# Patient Record
Sex: Female | Born: 1960 | Race: White | Hispanic: No | Marital: Married | Smoking: Former smoker
Health system: Southern US, Community
[De-identification: ages and names within clinical notes are randomized; demographics above are authoritative.]

## PROBLEM LIST (undated history)

## (undated) DIAGNOSIS — J209 Acute bronchitis, unspecified: Secondary | ICD-10-CM

## (undated) DIAGNOSIS — E785 Hyperlipidemia, unspecified: Secondary | ICD-10-CM

## (undated) DIAGNOSIS — I10 Essential (primary) hypertension: Secondary | ICD-10-CM

## (undated) HISTORY — PX: ABDOMINAL HYSTERECTOMY: SHX81

## (undated) HISTORY — DX: Essential (primary) hypertension: I10

## (undated) HISTORY — PX: BREAST CYST EXCISION: SHX579

## (undated) HISTORY — DX: Acute bronchitis, unspecified: J20.9

## (undated) HISTORY — DX: Hyperlipidemia, unspecified: E78.5

## (undated) HISTORY — PX: GALLBLADDER SURGERY: SHX652

---

## 2004-07-19 ENCOUNTER — Ambulatory Visit: Payer: Self-pay

## 2004-10-09 ENCOUNTER — Ambulatory Visit: Payer: Self-pay | Admitting: Internal Medicine

## 2005-04-09 ENCOUNTER — Ambulatory Visit: Payer: Self-pay | Admitting: Family Medicine

## 2005-05-17 ENCOUNTER — Ambulatory Visit: Payer: Self-pay | Admitting: Gastroenterology

## 2005-11-05 ENCOUNTER — Ambulatory Visit: Payer: Self-pay | Admitting: Family Medicine

## 2005-11-13 ENCOUNTER — Other Ambulatory Visit: Payer: Self-pay

## 2005-11-15 ENCOUNTER — Ambulatory Visit: Payer: Self-pay | Admitting: Surgery

## 2006-01-28 ENCOUNTER — Ambulatory Visit (HOSPITAL_BASED_OUTPATIENT_CLINIC_OR_DEPARTMENT_OTHER): Admission: RE | Admit: 2006-01-28 | Discharge: 2006-01-28 | Payer: Self-pay | Admitting: Orthopedic Surgery

## 2007-07-28 ENCOUNTER — Ambulatory Visit: Payer: Self-pay | Admitting: Family Medicine

## 2007-08-23 ENCOUNTER — Observation Stay: Payer: Self-pay | Admitting: Internal Medicine

## 2007-08-23 ENCOUNTER — Other Ambulatory Visit: Payer: Self-pay

## 2007-09-04 ENCOUNTER — Ambulatory Visit: Payer: Self-pay | Admitting: Family Medicine

## 2007-09-22 ENCOUNTER — Ambulatory Visit: Payer: Self-pay | Admitting: Gastroenterology

## 2008-02-10 ENCOUNTER — Ambulatory Visit: Payer: Self-pay | Admitting: Family Medicine

## 2008-11-08 ENCOUNTER — Ambulatory Visit: Payer: Self-pay

## 2008-11-25 ENCOUNTER — Ambulatory Visit: Payer: Self-pay | Admitting: Unknown Physician Specialty

## 2008-12-02 ENCOUNTER — Ambulatory Visit: Payer: Self-pay | Admitting: Unknown Physician Specialty

## 2009-06-26 ENCOUNTER — Ambulatory Visit: Payer: Self-pay | Admitting: General Practice

## 2010-06-14 ENCOUNTER — Ambulatory Visit: Payer: Self-pay | Admitting: Family Medicine

## 2010-07-12 ENCOUNTER — Ambulatory Visit: Payer: Self-pay | Admitting: Surgery

## 2011-01-14 ENCOUNTER — Ambulatory Visit: Payer: Self-pay | Admitting: Family Medicine

## 2011-12-25 ENCOUNTER — Ambulatory Visit: Payer: Self-pay | Admitting: Family Medicine

## 2012-07-22 ENCOUNTER — Ambulatory Visit: Payer: Self-pay | Admitting: Surgery

## 2013-01-18 ENCOUNTER — Ambulatory Visit: Payer: Self-pay | Admitting: General Practice

## 2013-04-17 ENCOUNTER — Observation Stay: Payer: Self-pay | Admitting: Internal Medicine

## 2013-04-17 ENCOUNTER — Ambulatory Visit: Payer: Self-pay

## 2013-04-17 LAB — CBC
MCH: 30.1 pg (ref 26.0–34.0)
MCHC: 35.6 g/dL (ref 32.0–36.0)
RBC: 4.57 10*6/uL (ref 3.80–5.20)
RDW: 13.2 % (ref 11.5–14.5)

## 2013-04-17 LAB — CK TOTAL AND CKMB (NOT AT ARMC)
CK, Total: 75 U/L (ref 21–215)
CK-MB: <0.5 ng/mL — ABNORMAL LOW (ref 0.5–3.6)

## 2013-04-17 LAB — TROPONIN I
Troponin-I: <0.02 ng/mL
Troponin-I: <0.02 ng/mL

## 2013-04-17 LAB — BASIC METABOLIC PANEL
BUN: 11 mg/dL (ref 7–18)
Calcium, Total: 9 mg/dL (ref 8.5–10.1)
EGFR (African American): >60
Osmolality: 275 (ref 275–301)

## 2013-04-18 LAB — CK TOTAL AND CKMB (NOT AT ARMC): CK-MB: <0.5 ng/mL — ABNORMAL LOW (ref 0.5–3.6)

## 2013-04-18 LAB — TROPONIN I: Troponin-I: <0.02 ng/mL

## 2013-04-26 ENCOUNTER — Ambulatory Visit (INDEPENDENT_AMBULATORY_CARE_PROVIDER_SITE_OTHER): Payer: 59 | Admitting: Cardiovascular Disease

## 2013-04-26 ENCOUNTER — Encounter: Payer: Self-pay | Admitting: Cardiovascular Disease

## 2013-04-26 VITALS — BP 130/80 | HR 79 | Ht 62.0 in | Wt 176.0 lb

## 2013-04-26 DIAGNOSIS — J40 Bronchitis, not specified as acute or chronic: Secondary | ICD-10-CM | POA: Insufficient documentation

## 2013-04-26 DIAGNOSIS — F17200 Nicotine dependence, unspecified, uncomplicated: Secondary | ICD-10-CM | POA: Insufficient documentation

## 2013-04-26 DIAGNOSIS — R079 Chest pain, unspecified: Secondary | ICD-10-CM

## 2013-04-26 DIAGNOSIS — R002 Palpitations: Secondary | ICD-10-CM

## 2013-04-26 DIAGNOSIS — J449 Chronic obstructive pulmonary disease, unspecified: Secondary | ICD-10-CM

## 2013-04-26 DIAGNOSIS — R29898 Other symptoms and signs involving the musculoskeletal system: Secondary | ICD-10-CM

## 2013-04-26 DIAGNOSIS — M5382 Other specified dorsopathies, cervical region: Secondary | ICD-10-CM

## 2013-04-26 NOTE — Assessment & Plan Note (Signed)
Does not appear to have active anginal symptoms. We have discussed various treatment options for her. She would prefer to hold off on any stress testing at this time as she feels well

## 2013-04-26 NOTE — Patient Instructions (Addendum)
You are doing well. No medication changes were made.  Please call the office if you have worsening shortness of breath or chest pain We would order a stress test  Please call us if you have new issues that need to be addressed before your next appt.  Your physician wants you to follow-up in: 12 months.  You will receive a reminder letter in the mail two months in advance. If you don't receive a letter, please call our office to schedule the follow-up appointment.

## 2013-04-26 NOTE — Assessment & Plan Note (Signed)
Bronchitis improved after Z-Pak

## 2013-04-26 NOTE — Assessment & Plan Note (Signed)
We have encouraged her to continue to work on weaning her cigarettes and smoking cessation. She will continue to work on this and does not want any assistance with chantix.  

## 2013-04-26 NOTE — Assessment & Plan Note (Signed)
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

## 2013-04-26 NOTE — Assessment & Plan Note (Signed)
Likely has mild underlying COPD. May benefit from albuterol inhaler

## 2013-04-26 NOTE — Progress Notes (Signed)
   Patient ID: Jackie Petty, female    DOB: 1961/01/15, 52 y.o.   MRN: [id number]  HPI Comments: Jackie Petty is a 52 year old woman with long history of smoking for 30 years, hyperlipidemia, with admission to the hospital August 2 until 04/18/2013 with symptoms of chest pain, diagnosed with acute bronchitis, hypertension, hyperlipidemia. She reports that she had a prednisone and Z-Pak which improved her symptoms her symptoms were pleuritic, chest pain with deep inspiration, thick rattly cough. Symptoms have now resolved and she has no further chest pain symptoms. Cardiac enzymes in the hospital were negative there was EKG change, d-dimer was normal  She does have periodic episodes of bronchitis She is active at baseline, no recent symptoms when doing her ADLs She works long hours in a machine shop   EKG shows normal sinus rhythm with rate 79 beats per minute, no significant ST or T wave changes      Outpatient Encounter Prescriptions as of 04/26/2013  Medication Sig Dispense Refill  . acetaminophen (TYLENOL) 325 MG tablet Take 650 mg by mouth every 6 (six) hours as needed for pain.      Marland Kitchen aspirin 81 MG tablet Take 81 mg by mouth daily.      . hydrochlorothiazide (MICROZIDE) 12.5 MG capsule Take 12.5 mg by mouth daily.      . nicotine (NICODERM CQ - DOSED IN MG/24 HOURS) 14 mg/24hr patch Place 1 patch onto the skin daily.      . pravastatin (PRAVACHOL) 40 MG tablet Take 40 mg by mouth daily.        Review of Systems  Constitutional: Negative.   HENT: Negative.   Eyes: Negative.   Respiratory: Negative.   Cardiovascular: Negative.        Chest pain symptoms and cough now resolved after antibiotics  Gastrointestinal: Negative.   Musculoskeletal: Negative.   Skin: Negative.   Neurological: Negative.   Psychiatric/Behavioral: Negative.   All other systems reviewed and are negative.    BP 130/80  Pulse 79  Ht 5\' 2"  (1.575 m)  Wt 176 lb (79.833 kg)  BMI 32.18 kg/m2  Physical  Exam  Nursing note and vitals reviewed. Constitutional: She is oriented to person, place, and time. She appears well-developed and well-nourished.  HENT:  Head: Normocephalic.  Nose: Nose normal.  Mouth/Throat: Oropharynx is clear and moist.  Eyes: Conjunctivae are normal. Pupils are equal, round, and reactive to light.  Neck: Normal range of motion. Neck supple. No JVD present.  Cardiovascular: Normal rate, regular rhythm, S1 normal, S2 normal, normal heart sounds and intact distal pulses.  Exam reveals no gallop and no friction rub.   No murmur heard. Pulmonary/Chest: Effort normal and breath sounds normal. No respiratory distress. She has no wheezes. She has no rales. She exhibits no tenderness.  Abdominal: Soft. Bowel sounds are normal. She exhibits no distension. There is no tenderness.  Musculoskeletal: Normal range of motion. She exhibits no edema and no tenderness.  Lymphadenopathy:    She has no cervical adenopathy.  Neurological: She is alert and oriented to person, place, and time. Coordination normal.  Skin: Skin is warm and dry. No rash noted. No erythema.  Psychiatric: She has a normal mood and affect. Her behavior is normal. Judgment and thought content normal.    Assessment and Plan

## 2013-05-05 ENCOUNTER — Ambulatory Visit: Payer: Self-pay | Admitting: Family Medicine

## 2013-07-22 ENCOUNTER — Other Ambulatory Visit: Payer: Self-pay

## 2014-06-29 ENCOUNTER — Ambulatory Visit: Payer: PRIVATE HEALTH INSURANCE | Admitting: Cardiovascular Disease

## 2014-07-01 ENCOUNTER — Ambulatory Visit: Payer: PRIVATE HEALTH INSURANCE | Admitting: Cardiovascular Disease

## 2015-01-06 NOTE — Discharge Summary (Signed)
PATIENT NAME:  Jackie Petty, Jackie Petty MR#:  [id number] DATE OF BIRTH:  15-Nov-1960  DATE OF ADMISSION:  04/17/2013 DATE OF DISCHARGE:  04/18/2013  ADMITTING DIAGNOSIS: Chest pain.   DISCHARGE DIAGNOSES:  1.  Chest pain, atypical in nature, positional in nature, unlikely cardiac, negative cardiac enzymes x 3. No EKG changes. The patient to follow up with outpatient cardiology for further evaluation.  2.  Hypertension.  3.  Hyperlipidemia.  4.  Acute bronchitis.   CONSULTANTS: None.   PERTINENT LABS AND EVALUATIONS: Admitting glucose 104, BUN 11, creatinine 0.77, sodium 138, potassium 3.9, chloride 104, CO2 was 30, calcium 9.0. Troponin was less than 0.05 x 3. WBC 7.3, hemoglobin 13.8, platelet count 174. D-dimer was 336. Chest x-ray showed no acute cardiopulmonary processes. EKG on admission showed normal sinus rhythm without any ST-T wave changes.   HOSPITAL COURSE: The patient is a 54 year old white female, who presented with having pleuritic type of chest pain, ongoing for the past 2 days, worsening with lying down. She also has some associated cough and productive sputum. Her pain was worse with taking deep breaths. The patient was seen in the ED, had a d-dimer that was negative. She was admitted under observation for serial cardiac enzymes, which remained negative. By the next morning, her chest pain had resolved. Due to atypical nature of the chest pain, the patient is being discharged. She does have some risk factors for coronary artery disease. so at this time I recommended she be seen by cardiology as an outpatient for a stress test. She is currently stable for discharge.   DISCHARGE MEDICATIONS: Aspirin 81 mg 1 tab p.o. daily, hydrochlorothiazide 12.5 p.o. daily, pravastatin 40 q. bedtime, acetaminophen 650 q.4 hours p.r.n. for pain and Z-Pak for 5 days.   DIET: Low sodium, low fat.   ACTIVITY: As tolerated.   DISCHARGE FOLLOWUP: Follow with Dr. Mariah MillingGollan for outpatient stress test in 1 to 2  weeks. Follow with primary M.D. in 1 to 2 weeks.    NOTE: 32 minutes spent on the discharge.   ____________________________ Lacie ScottsShreyang H. Allena KatzPatel, MD shp:aw D: 04/19/2013 08:30:04 ET T: 04/19/2013 08:57:20 ET JOB#: [id number]  cc: Korion Cuevas H. Allena KatzPatel, MD, <Dictator> Charise CarwinSHREYANG H Antwoine Zorn MD ELECTRONICALLY SIGNED 04/26/2013 10:25

## 2015-01-06 NOTE — H&P (Signed)
PATIENT NAME:  Jackie Petty, Jackie Petty MR#:  [id number] DATE OF BIRTH:  10-16-1960  DATE OF ADMISSION:  04/17/2013  PRIMARY CARE PHYSICIAN: Dr. Greggory StallionGeorge.   CHIEF COMPLAINT: Chest pain.   HISTORY OF PRESENT ILLNESS: This is a 54 year old female who presents to the hospital with a pleuritic-type chest pain ongoing for the past 2 days. She describes her pain as being a sharp pain radiating from the left side of her chest to the right side of her chest associated with a  cough, some productive sputum. She also says that the pain gets worse on deep inspiration. She does admit to some nausea, but no vomiting. Does admit to some diaphoresis, but no syncope,  no loss of consciousness or dizziness. She denies any shortness of breath with these symptoms. Since symptoms have been progressive and not improving, she was brought to the hospital for further evaluation. Her symptoms were somewhat suspicious for atypical angina.   REVIEW OF SYSTEMS:   CONSTITUTIONAL: No documented fever. No weight gain. No weight loss.  EYES: No blurred or double vision.  EARS, NOSE, THROAT: No tinnitus. No postnasal drip. No redness of the oropharynx.  RESPIRATORY: Positive cough. No wheeze. No hemoptysis. No dyspnea.  CARDIOVASCULAR: Positive chest pain. No orthopnea. No palpitations. No syncope.  GASTROINTESTINAL: Positive nausea. No vomiting. No diarrhea. No abdominal pain. No melena. No hematochezia.  GENITOURINARY: No dysuria. No hematuria.  ENDOCRINE: No polyuria or nocturia. No heat or cold intolerance.  HEMATOLOGIC: No anemia. No bruising. No bleeding.  INTEGUMENTARY: No rashes. No lesions.  MUSCULOSKELETAL: No arthritis. No swelling. No gout.  NEUROLOGIC: No numbness. No tingling. No ataxia. No seizure-type activity.  PSYCHIATRIC: No anxiety. No insomnia. No ADD.   PAST MEDICAL HISTORY: Consistent with hypertension, hyperlipidemia.   ALLERGIES: No known drug allergies.   SOCIAL HISTORY: Does smoke about 3 to 4 cigarettes  daily. Has been smoking for the past 30 years. Occasional alcohol use. No illicit drug abuse. Lives at home with her husband.   FAMILY HISTORY: The patient'Petty father is deceased, died from complications of COPD. Mother is alive, does have lung cancer. She does have strong history of heart disease on her mother'Petty side of the family.   CURRENT MEDICATIONS: Aspirin 81 mg daily, HCTZ 12.5 mg daily and Pravachol 40 mg daily.   PHYSICAL EXAMINATION ON ADMISSION:  VITAL SIGNS: Temperature is 98.3, pulse 73, respirations 20, blood pressure 119/86, sats 99% on room air.  GENERAL: The patient is a pleasant-appearing female in no apparent distress.  HEENT: Atraumatic, normocephalic. Extraocular muscles are intact. Pupils equal, reactive to light. Sclerae anicteric. No conjunctival injection. No pharyngeal erythema.  NECK: Supple. There is no jugular venous distention, no bruits, no lymphadenopathy, no thyromegaly.  HEART: Regular rate and rhythm. No murmurs. No rubs. No clicks.  LUNGS: Clear to auscultation bilaterally. No rales or rhonchi. No wheezes.  ABDOMEN: Soft, flat, nontender, nondistended. Has good bowel sounds. No hepatosplenomegaly appreciated.  EXTREMITIES: No evidence of any cyanosis, clubbing or peripheral edema. Has +2 pedal and radial pulses bilaterally.  NEUROLOGICAL: The patient is alert, awake and oriented x 3 with no focal motor or sensory deficits appreciated bilaterally.  SKIN: Moist and warm with no rashes appreciated.  LYMPHATIC: There is no cervical or axillary lymphadenopathy.   LABORATORY DATA: Serum glucose of 104, BUN 11, creatinine 0.7, sodium 138, potassium 3.9, chloride 104, bicarbonate 30. The patient'Petty troponin is less than 0.02. White cell count 7.3, hemoglobin 13.8, hematocrit 38.7, platelet count 174. D-dimer 0.3.  The patient did have a chest x-ray done which showed no evidence of acute cardiopulmonary disease.   ASSESSMENT AND PLAN: This is a 54 year old female with a  history of hypertension,  tobacco abuse, hyperlipidemia, who presents to the hospital with chest pain which is pleuritic in nature.  1.  Chest pain: The patient'Petty chest pain as mentioned is pleuritic in nature. Questionable if this is related to atypical angina versus possibly related to acute bronchitis. Unlikely it is pulmonary embolism, as the patient'Petty D-dimer is negative. I will observe her overnight on telemetry. Follow serial cardiac markers. Continue some aspirin, nitroglycerin, morphine and oxygen. Continue her Pravachol. I will also empirically treat her for a bronchitis with oral Levaquin. Follow sputum cultures. The patient likely would benefit from a stress test, but if her cardiac markers are negative, this can be done as an outpatient.  2.  Hypertension: Continue with her hydrochlorothiazide.  3.  Hyperlipidemia: Continue Pravachol.   CODE STATUS: The patient is a full code.   TIME SPENT: 45 minutes.    ____________________________ Rolly Pancake. Cherlynn Kaiser, MD vjs:jm D: 04/17/2013 15:51:50 ET T: 04/17/2013 16:46:21 ET JOB#: [id number]  cc: Rolly Pancake. Cherlynn Kaiser, MD, <Dictator> Houston Siren MD ELECTRONICALLY SIGNED 04/19/2013 14:45

## 2015-03-27 ENCOUNTER — Other Ambulatory Visit: Payer: Self-pay | Admitting: Family Medicine

## 2015-06-14 ENCOUNTER — Other Ambulatory Visit: Payer: Self-pay | Admitting: Family Medicine

## 2015-06-14 DIAGNOSIS — Z1231 Encounter for screening mammogram for malignant neoplasm of breast: Secondary | ICD-10-CM

## 2015-06-26 ENCOUNTER — Ambulatory Visit
Admission: RE | Admit: 2015-06-26 | Discharge: 2015-06-26 | Disposition: A | Discharge status: Home / Self Care | Payer: 59 | Source: Ambulatory Visit | Attending: Family Medicine | Admitting: Family Medicine

## 2015-06-26 DIAGNOSIS — Z1231 Encounter for screening mammogram for malignant neoplasm of breast: Secondary | ICD-10-CM | POA: Insufficient documentation

## 2016-09-05 ENCOUNTER — Other Ambulatory Visit: Payer: Self-pay | Admitting: Family Medicine

## 2016-09-05 DIAGNOSIS — Z1231 Encounter for screening mammogram for malignant neoplasm of breast: Secondary | ICD-10-CM

## 2016-09-23 ENCOUNTER — Encounter: Payer: Self-pay | Admitting: Radiology

## 2016-09-23 ENCOUNTER — Ambulatory Visit
Admission: RE | Admit: 2016-09-23 | Discharge: 2016-09-23 | Disposition: A | Discharge status: Home / Self Care | Payer: 59 | Source: Ambulatory Visit | Attending: Family Medicine | Admitting: Family Medicine

## 2016-09-23 DIAGNOSIS — Z1231 Encounter for screening mammogram for malignant neoplasm of breast: Secondary | ICD-10-CM

## 2018-06-22 ENCOUNTER — Other Ambulatory Visit: Payer: Self-pay | Admitting: Family Medicine

## 2018-06-22 DIAGNOSIS — Z1231 Encounter for screening mammogram for malignant neoplasm of breast: Secondary | ICD-10-CM

## 2018-07-13 ENCOUNTER — Emergency Department: Payer: 59

## 2018-07-13 ENCOUNTER — Emergency Department
Admission: EM | Admit: 2018-07-13 | Discharge: 2018-07-13 | Disposition: A | Discharge status: Home / Self Care | Payer: 59 | Attending: Emergency Medicine | Admitting: Emergency Medicine

## 2018-07-13 ENCOUNTER — Other Ambulatory Visit: Payer: Self-pay

## 2018-07-13 DIAGNOSIS — J449 Chronic obstructive pulmonary disease, unspecified: Secondary | ICD-10-CM | POA: Insufficient documentation

## 2018-07-13 DIAGNOSIS — Z87891 Personal history of nicotine dependence: Secondary | ICD-10-CM | POA: Insufficient documentation

## 2018-07-13 DIAGNOSIS — Z7982 Long term (current) use of aspirin: Secondary | ICD-10-CM | POA: Insufficient documentation

## 2018-07-13 DIAGNOSIS — I10 Essential (primary) hypertension: Secondary | ICD-10-CM | POA: Diagnosis not present

## 2018-07-13 DIAGNOSIS — R079 Chest pain, unspecified: Secondary | ICD-10-CM | POA: Insufficient documentation

## 2018-07-13 DIAGNOSIS — Z79899 Other long term (current) drug therapy: Secondary | ICD-10-CM | POA: Insufficient documentation

## 2018-07-13 LAB — FIBRIN DERIVATIVES D-DIMER (ARMC ONLY): Fibrin derivatives D-dimer (ARMC): 499.69 ng/mL (FEU) — ABNORMAL HIGH (ref 0.00–499.00)

## 2018-07-13 LAB — BASIC METABOLIC PANEL
ANION GAP: 9 (ref 5–15)
BUN: 16 mg/dL (ref 6–20)
CALCIUM: 9.2 mg/dL (ref 8.9–10.3)
CO2: 25 mmol/L (ref 22–32)
Chloride: 106 mmol/L (ref 98–111)
Creatinine, Ser: 0.78 mg/dL (ref 0.44–1.00)
GLUCOSE: 174 mg/dL — AB (ref 70–99)
Potassium: 3.5 mmol/L (ref 3.5–5.1)
SODIUM: 140 mmol/L (ref 135–145)

## 2018-07-13 LAB — CBC
HCT: 40.6 % (ref 36.0–46.0)
Hemoglobin: 13.3 g/dL (ref 12.0–15.0)
MCH: 29.2 pg (ref 26.0–34.0)
MCHC: 32.8 g/dL (ref 30.0–36.0)
MCV: 89 fL (ref 80.0–100.0)
NRBC: 0 % (ref 0.0–0.2)
PLATELETS: 172 10*3/uL (ref 150–400)
RBC: 4.56 MIL/uL (ref 3.87–5.11)
RDW: 12.2 % (ref 11.5–15.5)
WBC: 6.2 10*3/uL (ref 4.0–10.5)

## 2018-07-13 LAB — TROPONIN I

## 2018-07-13 MED ORDER — LIDOCAINE VISCOUS HCL 2 % MT SOLN
15.0000 mL | Freq: Once | OROMUCOSAL | Status: AC
  Administered 2018-07-13: 15 mL via OROMUCOSAL
  Filled 2018-07-13: qty 15

## 2018-07-13 NOTE — ED Provider Notes (Signed)
[facility] Emergency Department Provider Note  ____________________________________________   I have reviewed the triage vital signs and the nursing notes.   HISTORY  Chief Complaint Chest Pain   History limited by: Not Limited   HPI Jackie Petty is a 57 y.o. female who presents to the emergency department today because of concerns for chest pain.  The pain started 2 days ago.  She states that she was putting together futon when the pain started.  Is located in the left upper chest.  Does radiate around to the left upper back.  Patient denies any associated shortness of breath.  She does have a chronic cough but has not noticed any recent change to the.  She denies any nausea or vomiting.  She has been trying Tums without any relief.  She is also been trying aspirin without any relief.  She denies similar symptoms in the past. Denies any leg swelling or pain. Recently flew back from [place].   Per medical record review patient has a history of HLD, HTN.   Past Medical History:  Diagnosis Date  . Acute bronchitis   . Hyperlipidemia   . Hypertension     Patient Active Problem List   Diagnosis Date Noted  . Chest pain 04/26/2013  . Neck tightness 04/26/2013  . Smoker 04/26/2013  . COPD (chronic obstructive pulmonary disease) (HCC) 04/26/2013  . Bronchitis 04/26/2013    Past Surgical History:  Procedure Laterality Date  . ABDOMINAL HYSTERECTOMY    . BREAST CYST EXCISION Left   . GALLBLADDER SURGERY      Prior to Admission medications   Medication Sig Start Date End Date Taking? Authorizing Provider  acetaminophen (TYLENOL) 325 MG tablet Take 650 mg by mouth every 6 (six) hours as needed for pain.    [provider]  aspirin 81 MG tablet Take 81 mg by mouth daily.    [provider]  hydrochlorothiazide (MICROZIDE) 12.5 MG capsule Take 12.5 mg by mouth daily.    [provider]  nicotine (NICODERM CQ - DOSED IN MG/24  HOURS) 14 mg/24hr patch Place 1 patch onto the skin daily.    [provider]  pravastatin (PRAVACHOL) 40 MG tablet Take 40 mg by mouth daily.    [provider]    Allergies Simvastatin  Family History  Problem Relation Age of Onset  . Hypertension Mother   . Hyperlipidemia Mother   . Hypertension Maternal Grandmother   . Hyperlipidemia Maternal Grandmother   . Heart attack Maternal Grandfather 42  . Breast cancer Neg Hx     Social History Social History   Tobacco Use  . Smoking status: Former Smoker    Packs/day: 0.25    Years: 20.00    Pack years: 5.00    Types: Cigarettes  Substance Use Topics  . Alcohol use: No  . Drug use: No    Review of Systems Constitutional: No fever/chills Eyes: No visual changes. ENT: No sore throat. Cardiovascular: Left upper chest pain. Respiratory: Denies shortness of breath. Gastrointestinal: No abdominal pain.  No nausea, no vomiting.  No diarrhea.   Genitourinary: Negative for dysuria. Musculoskeletal: Left upper back pain. Skin: Negative for rash. Neurological: Negative for headaches, focal weakness or numbness.  ____________________________________________   PHYSICAL EXAM:  VITAL SIGNS: ED Triage Vitals  Enc Vitals Group     BP 07/13/18 1308 (!) 167/87     Pulse Rate 07/13/18 1308 74     Resp 07/13/18 1308 18  Temp 07/13/18 1308 98.6 F (37 C)     Temp src --      SpO2 07/13/18 1308 98 %     Weight 07/13/18 1307 175 lb 14.8 oz (79.8 kg)     Height --      Head Circumference --      Peak Flow --      Pain Score 07/13/18 1307 4   Constitutional: Alert and oriented.  Eyes: Conjunctivae are normal.  ENT      Head: Normocephalic and atraumatic.      Nose: No congestion/rhinnorhea.      Mouth/Throat: Mucous membranes are moist.      Neck: No stridor. Hematological/Lymphatic/Immunilogical: No cervical lymphadenopathy. Cardiovascular: Normal rate, regular rhythm.  No murmurs, rubs, or gallops.   Respiratory: Normal respiratory effort without tachypnea nor retractions. Breath sounds are clear and equal bilaterally. No wheezes/rales/rhonchi. Gastrointestinal: Soft and non tender. No rebound. No guarding.  Genitourinary: Deferred Musculoskeletal: Normal range of motion in all extremities. No lower extremity edema. Neurologic:  Normal speech and language. No gross focal neurologic deficits are appreciated.  Skin:  Skin is warm, dry and intact. No rash noted. Psychiatric: Mood and affect are normal. Speech and behavior are normal. Patient exhibits appropriate insight and judgment.  ____________________________________________    LABS (pertinent positives/negatives)  Trop <0.01 BMP wnl except glu 174 CBC wnl D-dimer 499.69  ____________________________________________   EKG  I, Phineas Semen, attending physician, personally viewed and interpreted this EKG  EKG Time: 1258 Rate: 74 Rhythm: normal sinus rhythm Axis: normal Intervals: qtc 461 QRS: incomplete RBBB ST changes: no st elevation Impression: abnormal ekg  ____________________________________________    RADIOLOGY  CXR No acute disease ____________________________________________   PROCEDURES  Procedures  ____________________________________________   INITIAL IMPRESSION / ASSESSMENT AND PLAN / ED COURSE  Pertinent labs & imaging results that were available during my care of the patient were reviewed by me and considered in my medical decision making (see chart for details).   Patient presented to the emergency department today because of concerns for chest pain.  Differential would be broad.  Differential would include pneumonia, pneumothorax, ACS, dissection, PE, esophagitis, GERD, costochondritis amongst other etiologies.  Patient's work-up without elevated troponin.  Chest x-ray without pneumonia or pneumothorax.  At this point I doubt PE or dissection.  Did want to investigate possible  esophagitis with the viscous lidocaine however patient stated that she wanted to leave the ED prior to evaluation after GI cocktail.  I was able to prepare paperwork for her. _______________________________________   FINAL CLINICAL IMPRESSION(S) / ED DIAGNOSES  Final diagnoses:  Nonspecific chest pain     Note: This dictation was prepared with Dragon dictation. Any transcriptional errors that result from this process are unintentional     Phineas Semen, MD 07/13/18 2317

## 2018-07-13 NOTE — ED Triage Notes (Signed)
Pt c/o chest pain radiating to shoulder and back x1 week.

## 2018-07-13 NOTE — ED Notes (Signed)

## 2018-07-13 NOTE — Discharge Instructions (Addendum)
Please seek medical attention for any high fevers, chest pain, shortness of breath, change in behavior, persistent vomiting, bloody stool or any other new or concerning symptoms.  

## 2018-07-21 ENCOUNTER — Ambulatory Visit
Admission: RE | Admit: 2018-07-21 | Discharge: 2018-07-21 | Disposition: A | Discharge status: Home / Self Care | Payer: 59 | Source: Ambulatory Visit | Attending: Family Medicine | Admitting: Family Medicine

## 2018-07-21 DIAGNOSIS — Z1231 Encounter for screening mammogram for malignant neoplasm of breast: Secondary | ICD-10-CM | POA: Insufficient documentation

## 2019-10-02 IMAGING — MG DIGITAL SCREENING BILATERAL MAMMOGRAM WITH TOMO AND CAD
8 series · 9 of 24 positions shown · non-contrast
Comparison: Previous exam(s).

CLINICAL DATA: Screening.

EXAM:
DIGITAL SCREENING BILATERAL MAMMOGRAM WITH TOMO AND CAD

[R MLO synth-2D]
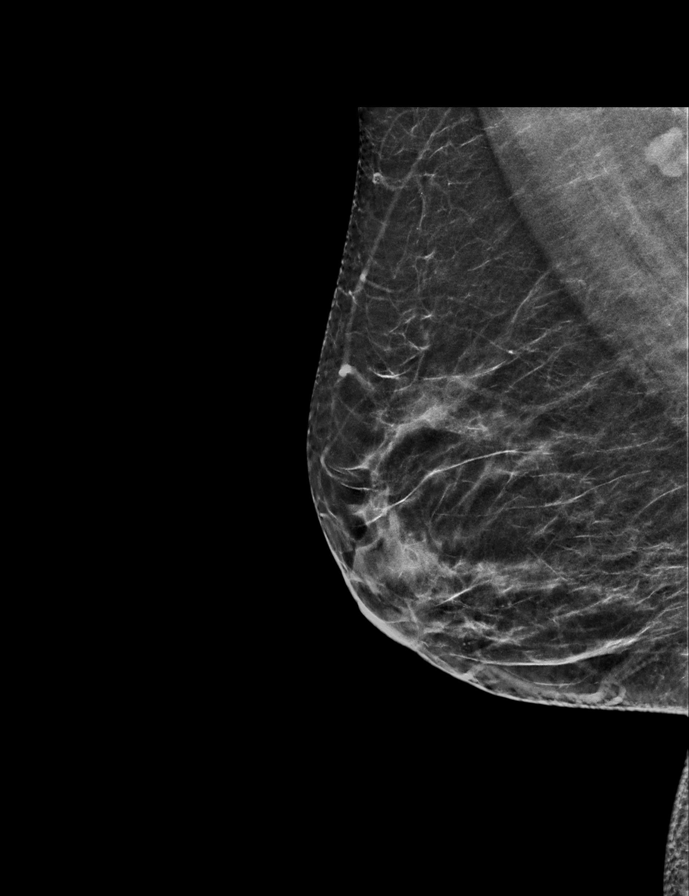

[R CC synth-2D]
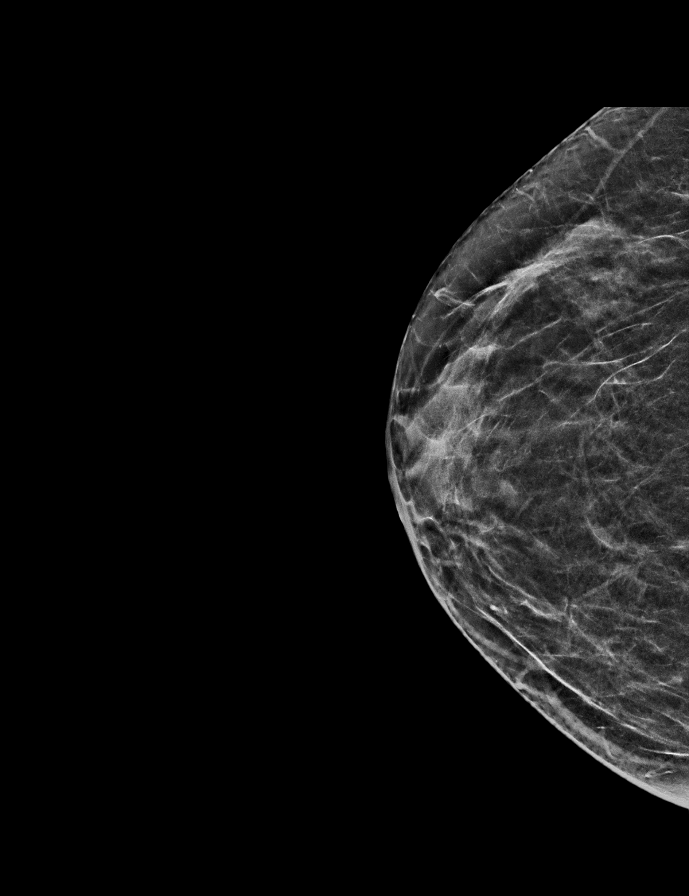

[L CC synth-2D]
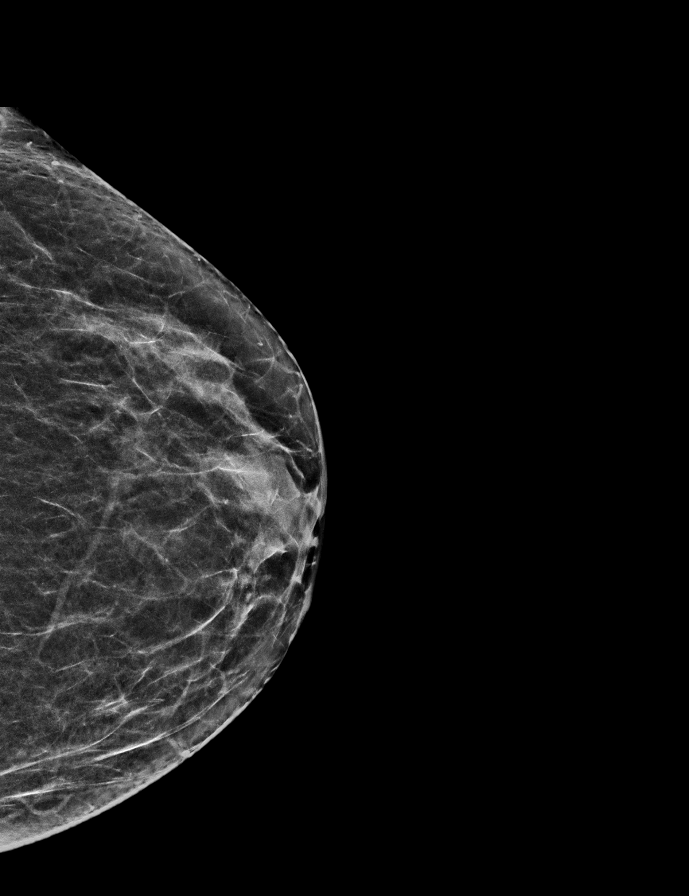

[L MLO synth-2D]
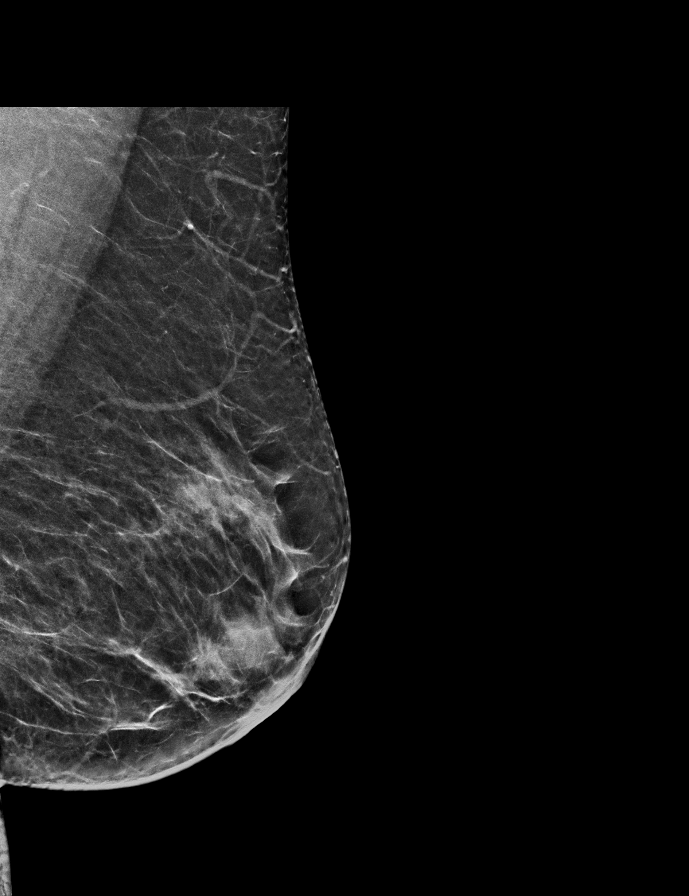

[L CC tomo · 2 of 49 frames shown]
[frame 16/49]
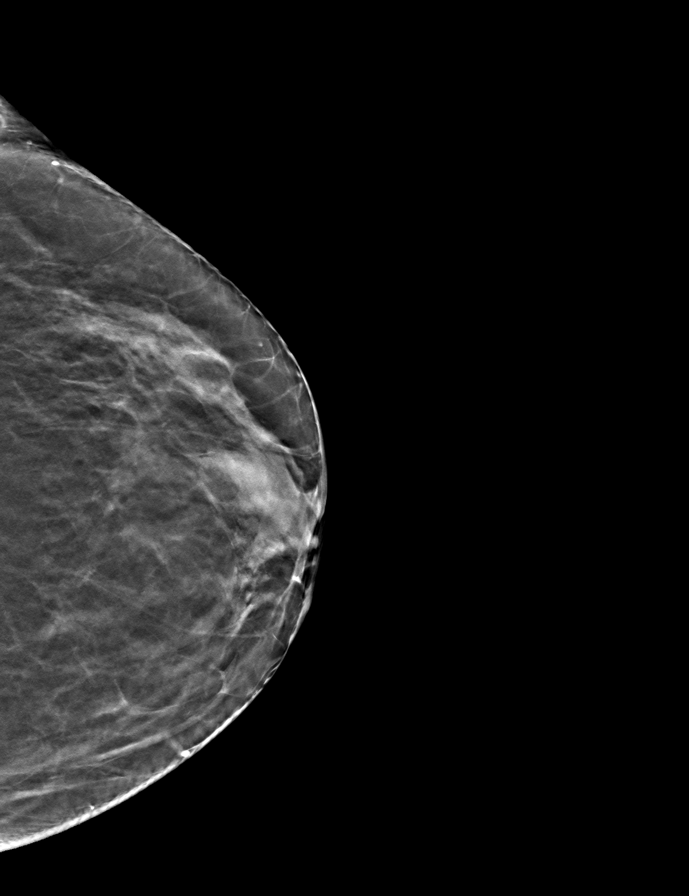
[frame 25/49]
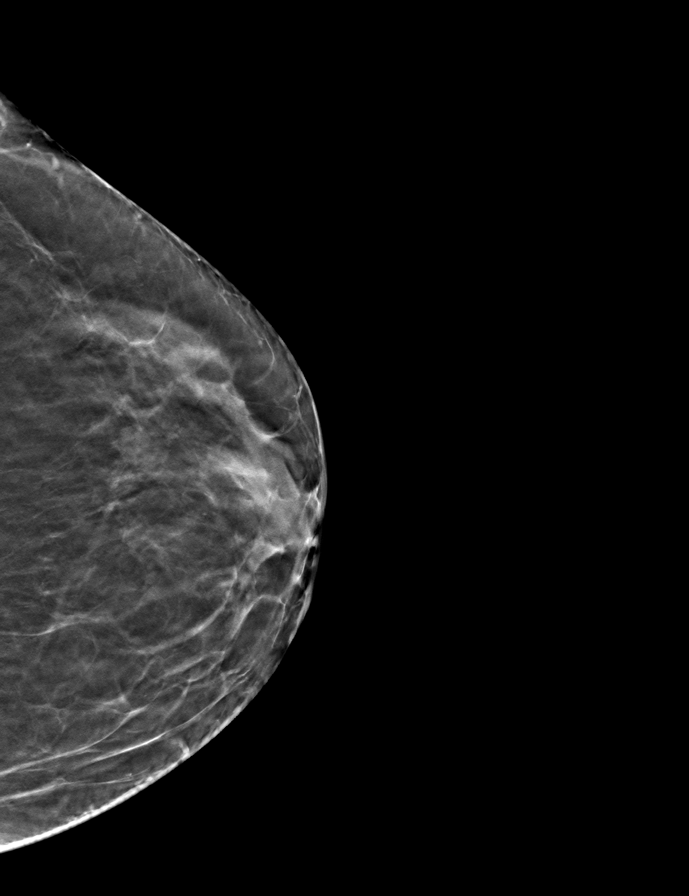

[L MLO tomo · tomo slice 29/56.0]
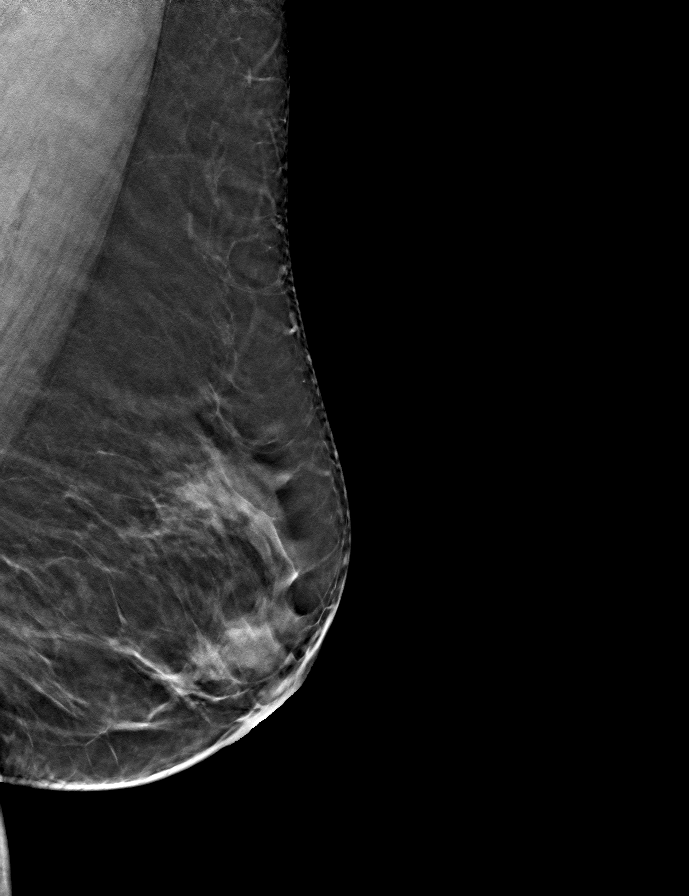

[R MLO tomo · tomo slice 29/57.0]
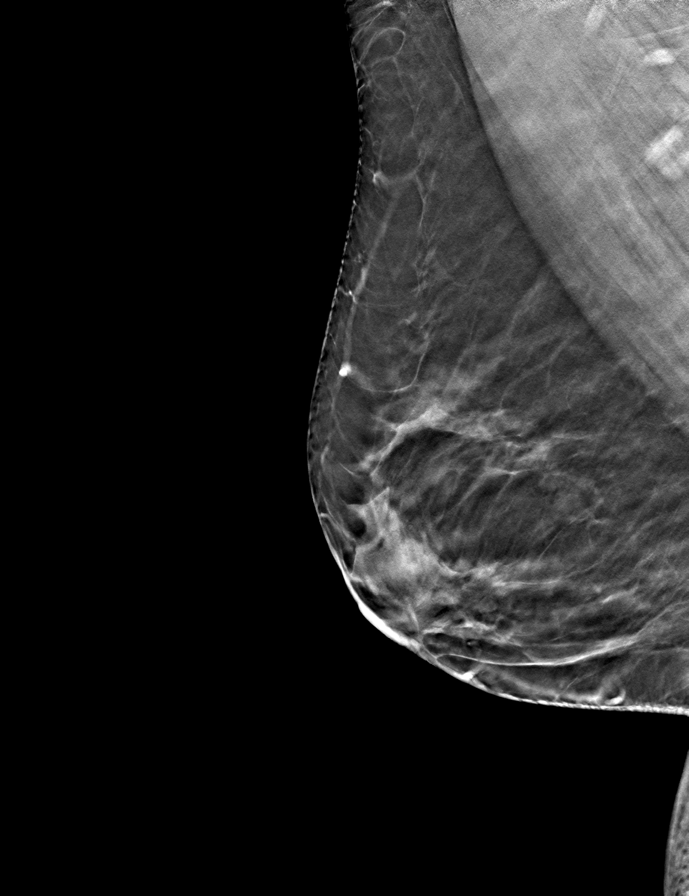

[R CC tomo · tomo slice 24/47.0]
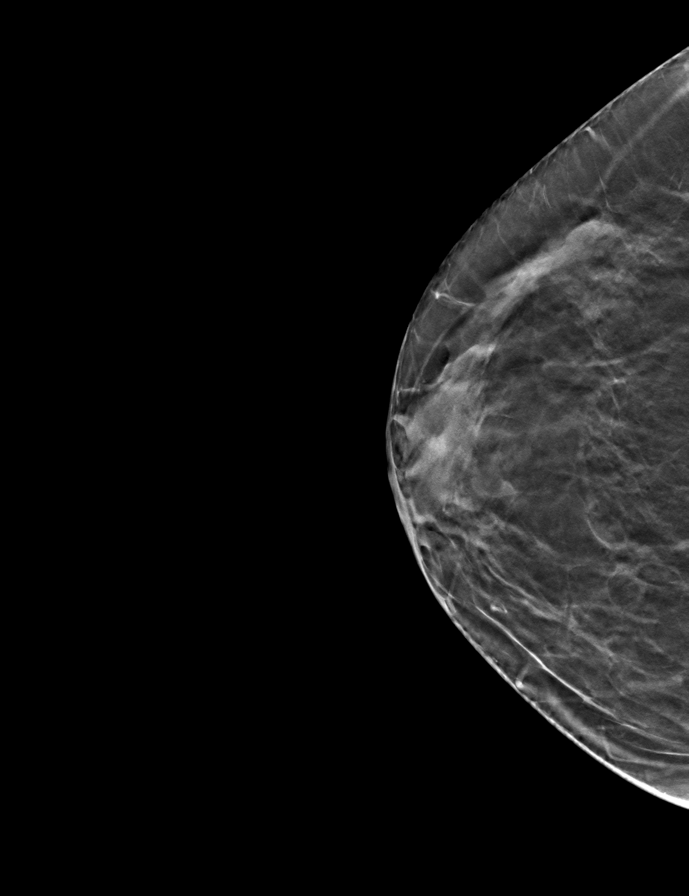

[9 of 24 positions shown; findings below may reference images not displayed]

ACR Breast Density Category b: There are scattered areas of
fibroglandular density.
FINDINGS: There are no findings suspicious for malignancy. Images were
processed with CAD.
IMPRESSION: No mammographic evidence of malignancy. A result letter of this
screening mammogram will be mailed directly to the patient.

RECOMMENDATION:
Screening mammogram in one year. (Code:CN-U-775)

BI-RADS CATEGORY  1: Negative.

## 2020-02-24 ENCOUNTER — Other Ambulatory Visit: Payer: Self-pay | Admitting: Family Medicine

## 2020-02-24 DIAGNOSIS — Z1231 Encounter for screening mammogram for malignant neoplasm of breast: Secondary | ICD-10-CM

## 2020-03-01 ENCOUNTER — Other Ambulatory Visit: Payer: Self-pay | Admitting: Family Medicine

## 2020-03-01 ENCOUNTER — Other Ambulatory Visit: Payer: Self-pay

## 2020-03-01 ENCOUNTER — Ambulatory Visit
Admission: RE | Admit: 2020-03-01 | Discharge: 2020-03-01 | Disposition: A | Discharge status: Home / Self Care | Payer: 59 | Source: Ambulatory Visit | Attending: Family Medicine | Admitting: Family Medicine

## 2020-03-01 DIAGNOSIS — Z1231 Encounter for screening mammogram for malignant neoplasm of breast: Secondary | ICD-10-CM | POA: Insufficient documentation

## 2020-03-01 DIAGNOSIS — R109 Unspecified abdominal pain: Secondary | ICD-10-CM

## 2020-03-07 ENCOUNTER — Other Ambulatory Visit: Payer: Self-pay | Admitting: Family Medicine

## 2020-03-07 DIAGNOSIS — Z1231 Encounter for screening mammogram for malignant neoplasm of breast: Secondary | ICD-10-CM

## 2020-03-10 ENCOUNTER — Ambulatory Visit
Admission: RE | Admit: 2020-03-10 | Discharge: 2020-03-10 | Disposition: A | Discharge status: Home / Self Care | Payer: 59 | Source: Ambulatory Visit | Attending: Family Medicine | Admitting: Family Medicine

## 2020-03-10 ENCOUNTER — Other Ambulatory Visit: Payer: Self-pay

## 2020-03-10 DIAGNOSIS — R109 Unspecified abdominal pain: Secondary | ICD-10-CM | POA: Diagnosis present

## 2021-03-23 ENCOUNTER — Other Ambulatory Visit: Payer: Self-pay | Admitting: Family Medicine

## 2021-03-23 DIAGNOSIS — Z1231 Encounter for screening mammogram for malignant neoplasm of breast: Secondary | ICD-10-CM

## 2021-05-29 ENCOUNTER — Other Ambulatory Visit: Payer: Self-pay | Admitting: Physician Assistant

## 2021-05-29 DIAGNOSIS — M5030 Other cervical disc degeneration, unspecified cervical region: Secondary | ICD-10-CM

## 2021-06-07 ENCOUNTER — Other Ambulatory Visit: Payer: Self-pay

## 2021-06-07 ENCOUNTER — Ambulatory Visit
Admission: RE | Admit: 2021-06-07 | Discharge: 2021-06-07 | Disposition: A | Discharge status: Home / Self Care | Payer: PRIVATE HEALTH INSURANCE | Source: Ambulatory Visit | Attending: Physician Assistant | Admitting: Physician Assistant

## 2021-06-07 DIAGNOSIS — M5030 Other cervical disc degeneration, unspecified cervical region: Secondary | ICD-10-CM | POA: Insufficient documentation

## 2021-09-25 ENCOUNTER — Ambulatory Visit (INDEPENDENT_AMBULATORY_CARE_PROVIDER_SITE_OTHER): Payer: No Typology Code available for payment source | Admitting: Urology

## 2021-09-25 ENCOUNTER — Other Ambulatory Visit: Payer: Self-pay | Admitting: *Deleted

## 2021-09-25 ENCOUNTER — Other Ambulatory Visit: Payer: Self-pay

## 2021-09-25 ENCOUNTER — Other Ambulatory Visit
Admission: RE | Admit: 2021-09-25 | Discharge: 2021-09-25 | Disposition: A | Discharge status: Home / Self Care | Payer: No Typology Code available for payment source | Attending: Urology | Admitting: Urology

## 2021-09-25 ENCOUNTER — Encounter: Payer: Self-pay | Admitting: Urology

## 2021-09-25 VITALS — BP 147/87 | HR 82 | Ht 61.0 in | Wt 172.0 lb

## 2021-09-25 DIAGNOSIS — R3121 Asymptomatic microscopic hematuria: Secondary | ICD-10-CM | POA: Diagnosis not present

## 2021-09-25 DIAGNOSIS — R310 Gross hematuria: Secondary | ICD-10-CM | POA: Diagnosis present

## 2021-09-25 LAB — URINALYSIS, COMPLETE (UACMP) WITH MICROSCOPIC
Bilirubin Urine: NEGATIVE
Glucose, UA: NEGATIVE mg/dL
Ketones, ur: NEGATIVE mg/dL
Leukocytes,Ua: NEGATIVE
Nitrite: NEGATIVE
Protein, ur: >300 mg/dL — AB
Specific Gravity, Urine: 1.02 (ref 1.005–1.030)
pH: 6.5 (ref 5.0–8.0)

## 2021-09-25 NOTE — Patient Instructions (Signed)

## 2021-09-25 NOTE — Progress Notes (Signed)
° °  09/25/21 2:01 PM   Jackie Petty Feb 09, 1961 [id number]  CC: Gross hematuria  HPI: 61 year old female who reports significant gross hematuria about 6 years ago that was associated with flank pain, and which she felt may have been a stone episode.  PCP checked UA at that time and was dipstick positive for blood, but microscopic was not performed.  She has had 3 similar episodes over the last 5 years.  She denies any flank pain or dysuria today.  She has a 15-pack-year smoking history.   Urinalysis today rare bacteria, 11-20 RBCs, 0-5 WBCs, 0-5 squamous cells, negative leukocytes, negative nitrites.  PMH: Past Medical History:  Diagnosis Date   Acute bronchitis    Hyperlipidemia    Hypertension     Surgical History: Past Surgical History:  Procedure Laterality Date   ABDOMINAL HYSTERECTOMY     BREAST CYST EXCISION Left    neg   GALLBLADDER SURGERY      Family History: Family History  Problem Relation Age of Onset   Hypertension Mother    Hyperlipidemia Mother    Hypertension Maternal Grandmother    Hyperlipidemia Maternal Grandmother    Heart attack Maternal Grandfather 38   Breast cancer Neg Hx     Social History:  reports that she has quit smoking. Her smoking use included cigarettes. She has a 5.00 pack-year smoking history. She has never used smokeless tobacco. She reports that she does not drink alcohol and does not use drugs.  Physical Exam: BP (!) 147/87    Pulse 82    Ht 5\' 1"  (1.549 m)    Wt 172 lb (78 kg)    BMI 32.50 kg/m    Constitutional:  Alert and oriented, No acute distress. Cardiovascular: No clubbing, cyanosis, or edema. Respiratory: Normal respiratory effort, no increased work of breathing. GI: Abdomen is soft, nontender, nondistended, no abdominal masses  Laboratory Data: Reviewed, see HPI  Pertinent Imaging: Abdominal ultrasound from June 2021 with some mild upper pole caliectasis without hydronephrosis, no solid mass or stones, possible  small left-sided stones, no hydronephrosis   Assessment & Plan:   61 year old female with 15-pack-year smoking history and significant episode of gross hematuria with clots a few weeks ago associated with some flank pain that resolved spontaneously.  Persistent microscopic hematuria today with 11-20 RBCs.  We discussed common possible etiologies of hematuria including malignancy, urolithiasis, medical renal disease, and idiopathic. Standard workup recommended by the [facility] includes imaging with CT urogram to assess the upper tracts, and cystoscopy. Cytology is performed on patient's with gross hematuria to look for malignant cells in the urine.  CT urogram and cystoscopy to complete hematuria work-up  Nickolas Madrid, MD 09/25/2021  [facility] [address], [place],  [address] [phone]

## 2021-10-10 ENCOUNTER — Ambulatory Visit: Payer: No Typology Code available for payment source

## 2021-10-11 ENCOUNTER — Ambulatory Visit: Admission: RE | Admit: 2021-10-11 | Payer: No Typology Code available for payment source | Source: Ambulatory Visit

## 2021-10-18 ENCOUNTER — Other Ambulatory Visit: Payer: Self-pay

## 2021-10-18 ENCOUNTER — Ambulatory Visit
Admission: RE | Admit: 2021-10-18 | Discharge: 2021-10-18 | Disposition: A | Discharge status: Home / Self Care | Payer: No Typology Code available for payment source | Source: Ambulatory Visit | Attending: Urology | Admitting: Urology

## 2021-10-18 DIAGNOSIS — R310 Gross hematuria: Secondary | ICD-10-CM | POA: Diagnosis present

## 2021-10-18 LAB — POCT I-STAT CREATININE: Creatinine, Ser: 0.4 mg/dL — ABNORMAL LOW (ref 0.44–1.00)

## 2021-10-18 MED ORDER — IOHEXOL 300 MG/ML  SOLN
100.0000 mL | Freq: Once | INTRAMUSCULAR | Status: AC | PRN
  Administered 2021-10-18: 100 mL via INTRAVENOUS

## 2021-10-24 ENCOUNTER — Encounter: Payer: Self-pay | Admitting: Urology

## 2021-10-24 ENCOUNTER — Ambulatory Visit (INDEPENDENT_AMBULATORY_CARE_PROVIDER_SITE_OTHER): Payer: No Typology Code available for payment source | Admitting: Urology

## 2021-10-24 ENCOUNTER — Other Ambulatory Visit: Payer: Self-pay

## 2021-10-24 VITALS — BP 143/83 | HR 81 | Ht 61.0 in | Wt 172.0 lb

## 2021-10-24 DIAGNOSIS — R310 Gross hematuria: Secondary | ICD-10-CM

## 2021-10-24 LAB — URINALYSIS, COMPLETE
Bilirubin, UA: NEGATIVE
Glucose, UA: NEGATIVE
Leukocytes,UA: NEGATIVE
Nitrite, UA: NEGATIVE
Specific Gravity, UA: 1.025 (ref 1.005–1.030)
Urobilinogen, Ur: 0.2 mg/dL (ref 0.2–1.0)
pH, UA: 7 (ref 5.0–7.5)

## 2021-10-24 LAB — MICROSCOPIC EXAMINATION

## 2021-10-24 MED ORDER — LIDOCAINE HCL URETHRAL/MUCOSAL 2 % EX GEL
1.0000 "application " | Freq: Once | CUTANEOUS | Status: AC
  Administered 2021-10-24: 1 via URETHRAL

## 2021-10-24 NOTE — Progress Notes (Signed)
Cystoscopy Procedure Note:  Indication: Gross and microscopic hematuria  After informed consent and discussion of the procedure and its risks, Jackie Petty was positioned and prepped in the standard fashion. Cystoscopy was performed with a flexible cystoscope. The urethra, bladder neck and entire bladder was visualized in a standard fashion. The ureteral orifices were visualized in their normal location and orientation.  Bladder mucosa was grossly normal throughout, no suspicious lesions, normal retroflexion.  Cytology sent.  Imaging: CT reviewed, no stones, hydronephrosis, or enhancing suspicious lesions  Findings: Normal cystoscopy  Assessment and Plan: Follow-up with urology as needed Call with cytology  Nickolas Madrid, MD 10/24/2021

## 2021-10-24 NOTE — Addendum Note (Signed)
Addended by: Frankey Shown on: 10/24/2021 02:33 PM   Modules accepted: Orders

## 2021-10-26 LAB — CYTOLOGY - NON PAP

## 2022-02-01 ENCOUNTER — Other Ambulatory Visit: Payer: Self-pay | Admitting: Family Medicine

## 2022-02-01 DIAGNOSIS — N63 Unspecified lump in unspecified breast: Secondary | ICD-10-CM

## 2022-02-21 ENCOUNTER — Ambulatory Visit
Admission: RE | Admit: 2022-02-21 | Discharge: 2022-02-21 | Disposition: A | Discharge status: Home / Self Care | Payer: No Typology Code available for payment source | Source: Ambulatory Visit | Attending: Family Medicine | Admitting: Family Medicine

## 2022-02-21 DIAGNOSIS — N63 Unspecified lump in unspecified breast: Secondary | ICD-10-CM | POA: Insufficient documentation

## 2022-06-04 ENCOUNTER — Emergency Department: Payer: No Typology Code available for payment source

## 2022-06-04 ENCOUNTER — Emergency Department
Admission: EM | Admit: 2022-06-04 | Discharge: 2022-06-04 | Disposition: A | Discharge status: Home / Self Care | Payer: No Typology Code available for payment source | Attending: Emergency Medicine | Admitting: Emergency Medicine

## 2022-06-04 ENCOUNTER — Other Ambulatory Visit: Payer: Self-pay

## 2022-06-04 ENCOUNTER — Encounter: Payer: Self-pay | Admitting: Emergency Medicine

## 2022-06-04 DIAGNOSIS — R1013 Epigastric pain: Secondary | ICD-10-CM

## 2022-06-04 DIAGNOSIS — I10 Essential (primary) hypertension: Secondary | ICD-10-CM | POA: Insufficient documentation

## 2022-06-04 DIAGNOSIS — D72829 Elevated white blood cell count, unspecified: Secondary | ICD-10-CM | POA: Diagnosis not present

## 2022-06-04 DIAGNOSIS — K2900 Acute gastritis without bleeding: Secondary | ICD-10-CM | POA: Insufficient documentation

## 2022-06-04 DIAGNOSIS — R109 Unspecified abdominal pain: Secondary | ICD-10-CM | POA: Diagnosis present

## 2022-06-04 LAB — URINALYSIS, ROUTINE W REFLEX MICROSCOPIC
Bilirubin Urine: NEGATIVE
Glucose, UA: NEGATIVE mg/dL
Ketones, ur: NEGATIVE mg/dL
Leukocytes,Ua: NEGATIVE
Nitrite: NEGATIVE
Protein, ur: 100 mg/dL — AB
Specific Gravity, Urine: 1.01 (ref 1.005–1.030)
Squamous Epithelial / HPF: NONE SEEN (ref 0–5)
pH: 7 (ref 5.0–8.0)

## 2022-06-04 LAB — CBC
HCT: 42.7 % (ref 36.0–46.0)
Hemoglobin: 14 g/dL (ref 12.0–15.0)
MCH: 28.9 pg (ref 26.0–34.0)
MCHC: 32.8 g/dL (ref 30.0–36.0)
MCV: 88.2 fL (ref 80.0–100.0)
Platelets: 245 10*3/uL (ref 150–400)
RBC: 4.84 MIL/uL (ref 3.87–5.11)
RDW: 12.6 % (ref 11.5–15.5)
WBC: 10.9 10*3/uL — ABNORMAL HIGH (ref 4.0–10.5)
nRBC: 0 % (ref 0.0–0.2)

## 2022-06-04 LAB — COMPREHENSIVE METABOLIC PANEL
ALT: 22 U/L (ref 0–44)
AST: 17 U/L (ref 15–41)
Albumin: 4.1 g/dL (ref 3.5–5.0)
Alkaline Phosphatase: 69 U/L (ref 38–126)
Anion gap: 10 (ref 5–15)
BUN: 14 mg/dL (ref 8–23)
CO2: 30 mmol/L (ref 22–32)
Calcium: 9.5 mg/dL (ref 8.9–10.3)
Chloride: 96 mmol/L — ABNORMAL LOW (ref 98–111)
Creatinine, Ser: 0.82 mg/dL (ref 0.44–1.00)
GFR, Estimated: >60 mL/min (ref >60)
Glucose, Bld: 151 mg/dL — ABNORMAL HIGH (ref 70–99)
Potassium: 3.2 mmol/L — ABNORMAL LOW (ref 3.5–5.1)
Sodium: 136 mmol/L (ref 135–145)
Total Bilirubin: 0.6 mg/dL (ref 0.3–1.2)
Total Protein: 6.7 g/dL (ref 6.5–8.1)

## 2022-06-04 LAB — LIPASE, BLOOD: Lipase: 30 U/L (ref 11–51)

## 2022-06-04 LAB — TROPONIN I (HIGH SENSITIVITY)
Troponin I (High Sensitivity): 5 ng/L (ref <18)
Troponin I (High Sensitivity): 5 ng/L (ref <18)

## 2022-06-04 MED ORDER — PANTOPRAZOLE SODIUM 40 MG PO TBEC: 40.0000 mg | DELAYED_RELEASE_TABLET | Freq: Every day | ORAL | 0 refills | Status: AC

## 2022-06-04 MED ORDER — ALUM & MAG HYDROXIDE-SIMETH 200-200-20 MG/5ML PO SUSP
30.0000 mL | Freq: Once | ORAL | Status: AC
Start: 2022-06-04 — End: 2022-06-04
  Administered 2022-06-04: 30 mL via ORAL
  Filled 2022-06-04: qty 30

## 2022-06-04 MED ORDER — MORPHINE SULFATE (PF) 4 MG/ML IV SOLN
4.0000 mg | Freq: Once | INTRAVENOUS | Status: AC
  Administered 2022-06-04: 4 mg via INTRAVENOUS
  Filled 2022-06-04: qty 1

## 2022-06-04 MED ORDER — IOHEXOL 300 MG/ML  SOLN
100.0000 mL | Freq: Once | INTRAMUSCULAR | Status: AC | PRN
  Administered 2022-06-04: 100 mL via INTRAVENOUS

## 2022-06-04 MED ORDER — ONDANSETRON HCL 4 MG/2ML IJ SOLN
4.0000 mg | Freq: Once | INTRAMUSCULAR | Status: AC
  Administered 2022-06-04: 4 mg via INTRAVENOUS
  Filled 2022-06-04: qty 2

## 2022-06-04 MED ORDER — SODIUM CHLORIDE 0.9 % IV BOLUS
1000.0000 mL | Freq: Once | INTRAVENOUS | Status: AC
  Administered 2022-06-04: 1000 mL via INTRAVENOUS

## 2022-06-04 MED ORDER — ONDANSETRON 4 MG PO TBDP: 4.0000 mg | ORAL_TABLET | Freq: Three times a day (TID) | ORAL | 0 refills | Status: AC | PRN

## 2022-06-04 MED ORDER — TRAMADOL HCL 50 MG PO TABS: 50.0000 mg | ORAL_TABLET | Freq: Four times a day (QID) | ORAL | 0 refills | Status: AC | PRN

## 2022-06-04 NOTE — ED Triage Notes (Signed)
PT here with abd and right flank pain. PT states she went for a follow up with her orthopedic provider and was given steroids which have not helped. Pt states pain is constant and is all upper epigastric and radiates to her back. Pt also has had a headache for 4 days, denies N/V/D.

## 2022-06-04 NOTE — ED Provider Notes (Signed)
[facility] Provider Note    Event Date/Time   First MD Initiated Contact with Patient 06/04/22 1238     (approximate)   History   Abdominal Pain and Flank Pain   HPI  Jackie Petty is a 61 y.o. female here with abdominal and flank pain.  The patient states that since starting steroids several days ago, she has had diffuse, aching, gnawing, generalized abdominal pain.  It moved over towards the left side than relocated towards the epigastric area.  It radiates to the towards her back.  Denies any vomiting.  No changes in her bowels.  No chest pain or shortness of breath.  Of note, patient was started on steroids for her chronic shoulder pain which continues to bother her.  She does take NSAIDs fairly regularly for this as well.  No known history of ulcers.  Denies any melena or blood in her stool.  Pain is diffuse, aching, cramp-like, worse with eating.     Physical Exam   Triage Vital Signs: ED Triage Vitals [06/04/22 1139]  Enc Vitals Group     BP (!) 144/77     Pulse Rate 60     Resp 18     Temp 98.3 F (36.8 C)     Temp Source Oral     SpO2 98 %     Weight 171 lb 15.3 oz (78 kg)     Height 5\' 1"  (1.549 m)     Head Circumference      Peak Flow      Pain Score 5     Pain Loc      Pain Edu?      Excl. in [place]?     Most recent vital signs: Vitals:   06/04/22 1139 06/04/22 1305  BP: (!) 144/77 132/75  Pulse: 60 (!) 59  Resp: 18 16  Temp: 98.3 F (36.8 C) 97.9 F (36.6 C)  SpO2: 98% 97%     General: Awake, no distress.  CV:  Good peripheral perfusion.  Resp:  Normal effort.  Abd:  No distention.  No overt tenderness.  No guarding or rebound.  No peritonitis. Other:  No lower extremity swelling.   ED Results / Procedures / Treatments   Labs (all labs ordered are listed, but only abnormal results are displayed) Labs Reviewed  COMPREHENSIVE METABOLIC PANEL - Abnormal; Notable for the following components:      Result Value    Potassium 3.2 (*)    Chloride 96 (*)    Glucose, Bld 151 (*)    All other components within normal limits  CBC - Abnormal; Notable for the following components:   WBC 10.9 (*)    All other components within normal limits  URINALYSIS, ROUTINE W REFLEX MICROSCOPIC - Abnormal; Notable for the following components:   Color, Urine YELLOW (*)    APPearance CLEAR (*)    Hgb urine dipstick MODERATE (*)    Protein, ur 100 (*)    Bacteria, UA RARE (*)    All other components within normal limits  LIPASE, BLOOD  TROPONIN I (HIGH SENSITIVITY)  TROPONIN I (HIGH SENSITIVITY)     EKG Normal sinus rhythm, ventricular rate 60.  PR 136, QRS 118, QTc 450.  No acute ST elevations or depressions.  No EKG evidence of acute ischemia or infarct.   RADIOLOGY CT: No acute abnormality, colonic diverticulosis, status post cholecystectomy   I also independently reviewed and agree with radiologist interpretations.   PROCEDURES:  Critical Care performed: No   MEDICATIONS ORDERED IN ED: Medications  morphine (PF) 4 MG/ML injection 4 mg (4 mg Intravenous Given 06/04/22 1403)  ondansetron (ZOFRAN) injection 4 mg (4 mg Intravenous Given 06/04/22 1403)  sodium chloride 0.9 % bolus 1,000 mL (1,000 mLs Intravenous New Bag/Given 06/04/22 1401)  alum & mag hydroxide-simeth (MAALOX/MYLANTA) 200-200-20 MG/5ML suspension 30 mL (30 mLs Oral Given 06/04/22 1402)  iohexol (OMNIPAQUE) 300 MG/ML solution 100 mL (100 mLs Intravenous Contrast Given 06/04/22 1432)     IMPRESSION / MDM / ASSESSMENT AND PLAN / ED COURSE  I reviewed the triage vital signs and the nursing notes.                               Ddx:  Differential includes the following, with pertinent life- or limb-threatening emergencies considered:  Gastritis, GERD, peptic ulcer disease in the setting of her steroid and NSAID use, less likely choledocholithiasis or cholangitis, she is status post cholecystectomy, colitis, diverticulitis,  obstruction  Patient's presentation is most consistent with acute presentation with potential threat to life or bodily function.  MDM:  61 year old female with history of hypertension, hyperlipidemia, status post cholecystectomy, here with diffuse abdominal pain, particularly in the right and epigastric area.  Lab work shows mild leukocytosis.  LFTs and bilirubin are normal.  No significant renal dysfunction.  Lipase is normal as well.  EKG is nonischemic and initial troponin is negative, do not suspect ACS.  Pain is not concerning for dissection and vital signs are overall fairly normal.  Urinalysis obtained, shows some minimal hematuria but patient has no evidence to suggest stone clinically.  CT abdomen and pelvis obtained, reviewed, and shows no acute abnormality.  She has diverticulosis but no evidence of diverticulitis.  No hydronephrosis noted.  Patient symptoms seem to correlate fairly directly with starting prednisone for her shoulder pain.  I suspect she likely has acute gastritis versus peptic ulcer disease.  She was given a GI cocktail here with significant improvement in her symptoms.  Will start her on a PPI, as well as give her a brief course of analgesia alternative to NSAIDs.  Plan to follow-up repeat troponin and if negative, discharged with outpatient follow-up.   MEDICATIONS GIVEN IN ED: Medications  morphine (PF) 4 MG/ML injection 4 mg (4 mg Intravenous Given 06/04/22 1403)  ondansetron (ZOFRAN) injection 4 mg (4 mg Intravenous Given 06/04/22 1403)  sodium chloride 0.9 % bolus 1,000 mL (1,000 mLs Intravenous New Bag/Given 06/04/22 1401)  alum & mag hydroxide-simeth (MAALOX/MYLANTA) 200-200-20 MG/5ML suspension 30 mL (30 mLs Oral Given 06/04/22 1402)  iohexol (OMNIPAQUE) 300 MG/ML solution 100 mL (100 mLs Intravenous Contrast Given 06/04/22 1432)     Consults:     EMR reviewed  Reviewed telephone note from today, prior PCP visits     FINAL CLINICAL IMPRESSION(S) / ED  DIAGNOSES   Final diagnoses:  Acute superficial gastritis without hemorrhage  Epigastric pain     Rx / DC Orders   ED Discharge Orders          Ordered    pantoprazole (PROTONIX) 40 MG tablet  Daily        06/04/22 1513    ondansetron (ZOFRAN-ODT) 4 MG disintegrating tablet  Every 8 hours PRN        06/04/22 1513    traMADol (ULTRAM) 50 MG tablet  Every 6 hours PRN        06/04/22 1513  Note:  This document was prepared using Dragon voice recognition software and may include unintentional dictation errors.   Shaune Pollack, MD 06/04/22 [phone]

## 2022-06-04 NOTE — Discharge Instructions (Signed)
For your symptoms:  - STOP the prednisone.  - STOP any Advil, Naproxen/Alleve, or other NSAID medications - START the Protonix tablet in the mornings before eating - Take the ULTRAM in place of this for pain, and stick to TYLENOL for mild pain - Avoid foods high in acid or particularly spicy  You can take over-the-counter MAALOX for indigestion/acid as well.  Call GI at the number above for follow-up.

## 2023-06-20 ENCOUNTER — Other Ambulatory Visit: Payer: Self-pay | Admitting: Family Medicine

## 2023-06-20 DIAGNOSIS — Z1231 Encounter for screening mammogram for malignant neoplasm of breast: Secondary | ICD-10-CM

## 2023-07-01 ENCOUNTER — Encounter: Payer: Self-pay | Admitting: *Deleted

## 2023-07-01 ENCOUNTER — Emergency Department
Admission: EM | Admit: 2023-07-01 | Discharge: 2023-07-01 | Disposition: A | Discharge status: Home / Self Care | Payer: PRIVATE HEALTH INSURANCE | Attending: Emergency Medicine | Admitting: Emergency Medicine

## 2023-07-01 ENCOUNTER — Emergency Department: Payer: PRIVATE HEALTH INSURANCE

## 2023-07-01 ENCOUNTER — Other Ambulatory Visit: Payer: Self-pay

## 2023-07-01 DIAGNOSIS — I10 Essential (primary) hypertension: Secondary | ICD-10-CM | POA: Diagnosis not present

## 2023-07-01 DIAGNOSIS — R0602 Shortness of breath: Secondary | ICD-10-CM | POA: Diagnosis present

## 2023-07-01 DIAGNOSIS — Z20822 Contact with and (suspected) exposure to covid-19: Secondary | ICD-10-CM | POA: Diagnosis not present

## 2023-07-01 DIAGNOSIS — J441 Chronic obstructive pulmonary disease with (acute) exacerbation: Secondary | ICD-10-CM | POA: Diagnosis not present

## 2023-07-01 LAB — CBC
HCT: 41.9 % (ref 36.0–46.0)
Hemoglobin: 13.6 g/dL (ref 12.0–15.0)
MCH: 28.5 pg (ref 26.0–34.0)
MCHC: 32.5 g/dL (ref 30.0–36.0)
MCV: 87.7 fL (ref 80.0–100.0)
Platelets: 243 10*3/uL (ref 150–400)
RBC: 4.78 MIL/uL (ref 3.87–5.11)
RDW: 12.6 % (ref 11.5–15.5)
WBC: 11.1 10*3/uL — ABNORMAL HIGH (ref 4.0–10.5)
nRBC: 0 % (ref 0.0–0.2)

## 2023-07-01 LAB — BASIC METABOLIC PANEL
Anion gap: 11 (ref 5–15)
BUN: 17 mg/dL (ref 8–23)
CO2: 28 mmol/L (ref 22–32)
Calcium: 9.2 mg/dL (ref 8.9–10.3)
Chloride: 97 mmol/L — ABNORMAL LOW (ref 98–111)
Creatinine, Ser: 1.06 mg/dL — ABNORMAL HIGH (ref 0.44–1.00)
GFR, Estimated: 59 mL/min — ABNORMAL LOW (ref >60)
Glucose, Bld: 219 mg/dL — ABNORMAL HIGH (ref 70–99)
Potassium: 4.1 mmol/L (ref 3.5–5.1)
Sodium: 136 mmol/L (ref 135–145)

## 2023-07-01 LAB — TROPONIN I (HIGH SENSITIVITY): Troponin I (High Sensitivity): 8 ng/L (ref <18)

## 2023-07-01 LAB — RESP PANEL BY RT-PCR (RSV, FLU A&B, COVID)  RVPGX2
Influenza A by PCR: NEGATIVE
Influenza B by PCR: NEGATIVE
Resp Syncytial Virus by PCR: NEGATIVE
SARS Coronavirus 2 by RT PCR: NEGATIVE

## 2023-07-01 LAB — D-DIMER, QUANTITATIVE: D-Dimer, Quant: 0.41 ug{FEU}/mL (ref 0.00–0.50)

## 2023-07-01 MED ORDER — IPRATROPIUM-ALBUTEROL 0.5-2.5 (3) MG/3ML IN SOLN
3.0000 mL | Freq: Once | RESPIRATORY_TRACT | Status: AC
  Administered 2023-07-01: 3 mL via RESPIRATORY_TRACT
  Filled 2023-07-01: qty 3

## 2023-07-01 MED ORDER — ALBUTEROL SULFATE (2.5 MG/3ML) 0.083% IN NEBU
2.5000 mg | INHALATION_SOLUTION | Freq: Once | RESPIRATORY_TRACT | Status: AC
  Administered 2023-07-01: 2.5 mg via RESPIRATORY_TRACT
  Filled 2023-07-01: qty 3

## 2023-07-01 MED ORDER — ALBUTEROL SULFATE HFA 108 (90 BASE) MCG/ACT IN AERS
2.0000 | INHALATION_SPRAY | RESPIRATORY_TRACT | Status: DC | PRN
  Administered 2023-07-01: 2 via RESPIRATORY_TRACT
  Filled 2023-07-01: qty 6.7

## 2023-07-01 MED ORDER — AZITHROMYCIN 500 MG PO TABS
500.0000 mg | ORAL_TABLET | Freq: Once | ORAL | Status: AC
  Administered 2023-07-01: 500 mg via ORAL
  Filled 2023-07-01: qty 1

## 2023-07-01 MED ORDER — ALBUTEROL SULFATE (2.5 MG/3ML) 0.083% IN NEBU: 2.5000 mg | INHALATION_SOLUTION | Freq: Once | RESPIRATORY_TRACT | Status: DC

## 2023-07-01 MED ORDER — PREDNISONE 10 MG PO TABS: 40.0000 mg | ORAL_TABLET | Freq: Every day | ORAL | 0 refills | Status: AC

## 2023-07-01 MED ORDER — AZITHROMYCIN 500 MG PO TABS: 500.0000 mg | ORAL_TABLET | Freq: Every day | ORAL | 0 refills | Status: AC

## 2023-07-01 MED ORDER — PREDNISONE 20 MG PO TABS
40.0000 mg | ORAL_TABLET | Freq: Once | ORAL | Status: AC
  Administered 2023-07-01: 40 mg via ORAL
  Filled 2023-07-01: qty 2

## 2023-07-01 NOTE — ED Notes (Signed)
Provided pt with discharge instructions and education. All of pt questions answered. Pt in possession of all belongings. Pt AAOX4 and stable at time of discharge.Pt ambulated w/ steady gait towards ED exit. Pt accompanied by family member.

## 2023-07-01 NOTE — Discharge Instructions (Signed)
I suspect today's episode was a COPD exacerbation as it did improve following a breathing treatment.  Your workup otherwise is reassuring today.  Please use your butyryl inhaler as needed.  I have sent antibiotics and steroids to your pharmacy for you to pick up tomorrow and take as prescribed.

## 2023-07-01 NOTE — ED Notes (Signed)
ED Provider at bedside. 

## 2023-07-01 NOTE — ED Provider Notes (Signed)
[facility] Provider Note    Event Date/Time   First MD Initiated Contact with Patient 07/01/23 1852     (approximate)   History   Shortness of Breath   HPI Jackie Petty is a 62 y.o. female with history of COPD, HTN, HLD presenting today for shortness of breath.  Patient states around 4 PM today she started feeling shortness of breath.  Felt like a chest tightness and like she could not take a deep breath.  Otherwise denies fever, chills, cough, congestion, chest pain, nausea, vomiting, abdominal pain, leg pain, leg swelling.  No prior history of blood clots.  Not on any medications for COPD.  Prior 40-year smoking history but not currently smoking at this time.  Reviewed most recent PCP visit on 06/11/2023 and chart review.     Physical Exam   Triage Vital Signs: ED Triage Vitals  Encounter Vitals Group     BP 07/01/23 1814 (!) 164/78     Systolic BP Percentile --      Diastolic BP Percentile --      Pulse Rate 07/01/23 1814 82     Resp 07/01/23 1814 (!) 28     Temp 07/01/23 1814 98.2 F (36.8 C)     Temp Source 07/01/23 1814 Oral     SpO2 07/01/23 1814 93 %     Weight 07/01/23 1813 169 lb (76.7 kg)     Height 07/01/23 1813 5\' 1"  (1.549 m)     Head Circumference --      Peak Flow --      Pain Score 07/01/23 1813 0     Pain Loc --      Pain Education --      Exclude from Growth Chart --     Most recent vital signs: Vitals:   07/01/23 1814 07/01/23 1900  BP: (!) 164/78 131/84  Pulse: 82 75  Resp: (!) 28 18  Temp: 98.2 F (36.8 C)   SpO2: 93% 98%   Physical Exam: I have reviewed the vital signs and nursing notes. General: Awake, alert, no acute distress.  Nontoxic appearing. Head:  Atraumatic, normocephalic.   ENT:  EOM intact, PERRL. Oral mucosa is pink and moist with no lesions. Neck: Neck is supple with full range of motion, No meningeal signs. Cardiovascular:  RRR, No murmurs. Peripheral pulses palpable and equal  bilaterally. Respiratory:  Symmetrical chest wall expansion.  No rhonchi, rales, or wheezes.  Good air movement throughout.  No use of accessory muscles.  Diminished air movement present throughout lungs but no overt wheezing. Musculoskeletal:  No cyanosis or edema. Moving extremities with full ROM Abdomen:  Soft, nontender, nondistended. Neuro:  GCS 15, moving all four extremities, interacting appropriately. Speech clear. Psych:  Calm, appropriate.   Skin:  Warm, dry, no rash.    ED Results / Procedures / Treatments   Labs (all labs ordered are listed, but only abnormal results are displayed) Labs Reviewed  BASIC METABOLIC PANEL - Abnormal; Notable for the following components:      Result Value   Chloride 97 (*)    Glucose, Bld 219 (*)    Creatinine, Ser 1.06 (*)    GFR, Estimated 59 (*)    All other components within normal limits  CBC - Abnormal; Notable for the following components:   WBC 11.1 (*)    All other components within normal limits  RESP PANEL BY RT-PCR (RSV, FLU A&B, COVID)  [id number]  D-DIMER, QUANTITATIVE  TROPONIN I (HIGH SENSITIVITY)     EKG My EKG interpretation: Rate of 79, normal sinus rhythm, normal axis, normal intervals.  Possible incomplete right bundle branch block.  No acute ST elevations or depressions.   RADIOLOGY Independently interpreted chest x-ray with no acute pathology   PROCEDURES:  Critical Care performed: No  Procedures   MEDICATIONS ORDERED IN ED: Medications  albuterol (VENTOLIN HFA) 108 (90 Base) MCG/ACT inhaler 2 puff (has no administration in time range)  predniSONE (DELTASONE) tablet 40 mg (has no administration in time range)  azithromycin (ZITHROMAX) tablet 500 mg (has no administration in time range)  ipratropium-albuterol (DUONEB) 0.5-2.5 (3) MG/3ML nebulizer solution 3 mL (3 mLs Nebulization Given 07/01/23 1945)     IMPRESSION / MDM / ASSESSMENT AND PLAN / ED COURSE  I reviewed the triage vital signs and the  nursing notes.                              Differential diagnosis includes, but is not limited to, COPD exacerbation, pneumonia, pneumothorax, less likely ACS  Patient's presentation is most consistent with acute complicated illness / injury requiring diagnostic workup.  Patient is a 62 year old female presenting today for shortness of breath and chest tightness.  Documented history of COPD although patient states she is not on any medication and was told she had this by a physician.  Vital signs are stable on arrival with no tachycardia or hypoxia.  Negative viral panel.  Patient was given DuoNeb to assist with potential COPD exacerbation.  EKG unremarkable and troponin negative.  Mild leukocytosis at 11.1.  BMP otherwise unremarkable.  Patient had negative D-dimer and chest x-ray was negative for acute pathology.  Reassessed after breathing treatment and she noted complete resolution in symptoms.  Feeling back to baseline at this time.  Do suspect likely COPD exacerbation.  Patient will be started on prednisone and azithromycin for uncomplicated COPD exacerbation.  Was also given inhaler for her to take home and use as needed.  Given strict return precautions and told to follow-up with PCP.  The patient is on the cardiac monitor to evaluate for evidence of arrhythmia and/or significant heart rate changes. Clinical Course as of 07/01/23 2050  Tue Jul 01, 2023  2015 D-Dimer, Quant: 0.41 [DW]  2040 DG Chest 2 View No acute pathology noted [DW]  2046 Patient with complete resolution of shortness of breath symptoms following DuoNeb.  Will discharge her for first time COPD exacerbation [DW]    Clinical Course User Index [DW] Janith Lima, MD     FINAL CLINICAL IMPRESSION(S) / ED DIAGNOSES   Final diagnoses:  COPD exacerbation (HCC)     Rx / DC Orders   ED Discharge Orders          Ordered    azithromycin (ZITHROMAX) 500 MG tablet  Daily        07/01/23 2048    predniSONE  (DELTASONE) 10 MG tablet  Daily        07/01/23 2048             Note:  This document was prepared using Dragon voice recognition software and may include unintentional dictation errors.   Janith Lima, MD 07/01/23 2052

## 2023-07-01 NOTE — ED Triage Notes (Signed)
Pt ambulatory to triage.  Pt reports sob.  Pt also chest pain.  No fever.  Pt has a cough.  Pt also report nausea.  Pt alert  speech clear.

## 2023-07-01 NOTE — ED Notes (Signed)
PT states breathing effort improved after breathing treatment

## 2023-07-01 NOTE — ED Notes (Addendum)
Assumed care of pt at this time. Pt is AAXO4, on CCM, VS stable and WNL. Pt states she was working in her kitchen and believes that they may have caused her COPD to flare up around 1700. Call light within reach, side rails up x2. No needs identified at this time. Pt accompanied by family member

## 2023-11-04 ENCOUNTER — Ambulatory Visit
Admission: RE | Admit: 2023-11-04 | Discharge: 2023-11-04 | Disposition: A | Discharge status: Home / Self Care | Payer: Medicare Other | Source: Ambulatory Visit | Attending: Family Medicine | Admitting: Family Medicine

## 2023-11-04 DIAGNOSIS — Z1231 Encounter for screening mammogram for malignant neoplasm of breast: Secondary | ICD-10-CM | POA: Diagnosis present

## 2024-06-29 ENCOUNTER — Other Ambulatory Visit: Payer: Self-pay | Admitting: Physical Medicine & Rehabilitation

## 2024-06-29 DIAGNOSIS — M542 Cervicalgia: Secondary | ICD-10-CM

## 2024-07-03 ENCOUNTER — Ambulatory Visit
Admission: RE | Admit: 2024-07-03 | Discharge: 2024-07-03 | Disposition: A | Discharge status: Home / Self Care | Source: Ambulatory Visit | Attending: Physical Medicine & Rehabilitation | Admitting: Physical Medicine & Rehabilitation

## 2024-07-03 DIAGNOSIS — M542 Cervicalgia: Secondary | ICD-10-CM

## 2024-07-13 ENCOUNTER — Other Ambulatory Visit: Payer: Self-pay | Admitting: Physical Medicine & Rehabilitation

## 2024-07-13 DIAGNOSIS — M5412 Radiculopathy, cervical region: Secondary | ICD-10-CM

## 2024-07-13 DIAGNOSIS — M542 Cervicalgia: Secondary | ICD-10-CM

## 2024-08-10 ENCOUNTER — Ambulatory Visit
Admission: RE | Admit: 2024-08-10 | Discharge: 2024-08-10 | Disposition: A | Source: Ambulatory Visit | Attending: Physical Medicine & Rehabilitation | Admitting: Physical Medicine & Rehabilitation

## 2024-08-10 DIAGNOSIS — M5412 Radiculopathy, cervical region: Secondary | ICD-10-CM

## 2024-08-10 DIAGNOSIS — M542 Cervicalgia: Secondary | ICD-10-CM

## 2024-08-10 MED ORDER — TRIAMCINOLONE ACETONIDE 40 MG/ML IJ SUSP (RADIOLOGY)
60.0000 mg | Freq: Once | INTRAMUSCULAR | Status: AC
  Administered 2024-08-10: 60 mg via EPIDURAL

## 2024-08-10 MED ORDER — IOPAMIDOL (ISOVUE-300) INJECTION 61%
3.0000 mL | Freq: Once | INTRAVENOUS | Status: AC | PRN
  Administered 2024-08-10: 3 mL

## 2024-08-10 NOTE — Discharge Instructions (Signed)
